# Patient Record
Sex: Male | Born: 2005 | Race: Black or African American | Hispanic: No | Marital: Single | Smoking: Never smoker
Health system: Southern US, Community
[De-identification: ages and names within clinical notes are randomized; demographics above are authoritative.]

## PROBLEM LIST (undated history)

## (undated) DIAGNOSIS — J302 Other seasonal allergic rhinitis: Secondary | ICD-10-CM

## (undated) DIAGNOSIS — J45909 Unspecified asthma, uncomplicated: Secondary | ICD-10-CM

---
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
## 2024-02-05 ENCOUNTER — Other Ambulatory Visit: Payer: Self-pay

## 2024-02-05 ENCOUNTER — Emergency Department (HOSPITAL_BASED_OUTPATIENT_CLINIC_OR_DEPARTMENT_OTHER)
Admission: EM | Admit: 2024-02-05 | Discharge: 2024-02-05 | Discharge status: Against Medical Advice | Payer: MEDICAID | Attending: Emergency Medicine | Admitting: Emergency Medicine

## 2024-02-05 DIAGNOSIS — R0981 Nasal congestion: Secondary | ICD-10-CM | POA: Diagnosis not present

## 2024-02-05 DIAGNOSIS — R051 Acute cough: Secondary | ICD-10-CM

## 2024-02-05 DIAGNOSIS — Z5321 Procedure and treatment not carried out due to patient leaving prior to being seen by health care provider: Secondary | ICD-10-CM | POA: Diagnosis not present

## 2024-02-05 DIAGNOSIS — R059 Cough, unspecified: Secondary | ICD-10-CM | POA: Insufficient documentation

## 2024-02-05 DIAGNOSIS — R519 Headache, unspecified: Secondary | ICD-10-CM | POA: Diagnosis not present

## 2024-02-05 LAB — RESP PANEL BY RT-PCR (RSV, FLU A&B, COVID)  RVPGX2
Influenza A by PCR: NEGATIVE
Influenza B by PCR: NEGATIVE
Resp Syncytial Virus by PCR: NEGATIVE
SARS Coronavirus 2 by RT PCR: NEGATIVE

## 2024-02-05 NOTE — ED Triage Notes (Signed)
 C/o cough, congestion, and headache x 2 days. Denies fevers.

## 2024-02-06 NOTE — ED Provider Notes (Signed)
 Patient left without being seen.    Taylor Longs, MD 02/06/24 [phone]
